# Patient Record
Sex: Male | Born: 1955 | Race: Black or African American | Hispanic: No | Smoking: Current some day smoker
Health system: Southern US, Community
[De-identification: ages and names within clinical notes are randomized; demographics above are authoritative.]

## PROBLEM LIST (undated history)

## (undated) DIAGNOSIS — E7800 Pure hypercholesterolemia, unspecified: Secondary | ICD-10-CM

## (undated) DIAGNOSIS — N200 Calculus of kidney: Secondary | ICD-10-CM

## (undated) DIAGNOSIS — M1990 Unspecified osteoarthritis, unspecified site: Secondary | ICD-10-CM

## (undated) DIAGNOSIS — E119 Type 2 diabetes mellitus without complications: Secondary | ICD-10-CM

## (undated) DIAGNOSIS — I10 Essential (primary) hypertension: Secondary | ICD-10-CM

## (undated) HISTORY — PX: HERNIA REPAIR: SHX51

## (undated) HISTORY — PX: TONSILLECTOMY: SUR1361

---

## 2004-07-21 ENCOUNTER — Emergency Department (HOSPITAL_COMMUNITY): Admission: EM | Admit: 2004-07-21 | Discharge: 2004-07-21 | Payer: Self-pay | Admitting: *Deleted

## 2006-09-29 ENCOUNTER — Emergency Department (HOSPITAL_COMMUNITY): Admission: EM | Admit: 2006-09-29 | Discharge: 2006-09-29 | Payer: Self-pay | Admitting: Emergency Medicine

## 2006-12-11 ENCOUNTER — Emergency Department (HOSPITAL_COMMUNITY): Admission: EM | Admit: 2006-12-11 | Discharge: 2006-12-11 | Payer: Self-pay | Admitting: Family Medicine

## 2007-07-30 ENCOUNTER — Emergency Department (HOSPITAL_COMMUNITY): Admission: EM | Admit: 2007-07-30 | Discharge: 2007-07-30 | Payer: Self-pay | Admitting: Emergency Medicine

## 2010-11-17 ENCOUNTER — Inpatient Hospital Stay (INDEPENDENT_AMBULATORY_CARE_PROVIDER_SITE_OTHER)
Admission: RE | Admit: 2010-11-17 | Discharge: 2010-11-17 | Disposition: A | Discharge status: Home / Self Care | Payer: Medicare Other | Source: Ambulatory Visit | Attending: Emergency Medicine | Admitting: Emergency Medicine

## 2010-11-17 ENCOUNTER — Ambulatory Visit (INDEPENDENT_AMBULATORY_CARE_PROVIDER_SITE_OTHER): Payer: Medicare Other

## 2010-11-17 DIAGNOSIS — J189 Pneumonia, unspecified organism: Secondary | ICD-10-CM

## 2010-11-26 ENCOUNTER — Ambulatory Visit (INDEPENDENT_AMBULATORY_CARE_PROVIDER_SITE_OTHER): Payer: Medicare Other

## 2010-11-26 ENCOUNTER — Inpatient Hospital Stay (INDEPENDENT_AMBULATORY_CARE_PROVIDER_SITE_OTHER)
Admission: RE | Admit: 2010-11-26 | Discharge: 2010-11-26 | Disposition: A | Discharge status: Home / Self Care | Payer: Medicare Other | Source: Ambulatory Visit | Attending: Family Medicine | Admitting: Family Medicine

## 2010-11-26 DIAGNOSIS — R05 Cough: Secondary | ICD-10-CM

## 2012-08-24 IMAGING — CR DG CHEST 2V
2 series · 2 of 2 positions shown · non-contrast
Comparison: 11/17/2010

CLINICAL DATA: Fever.  Cough.  Follow-up pneumonia.

CHEST - 2 VIEW

[view not recorded (1 of 2)]
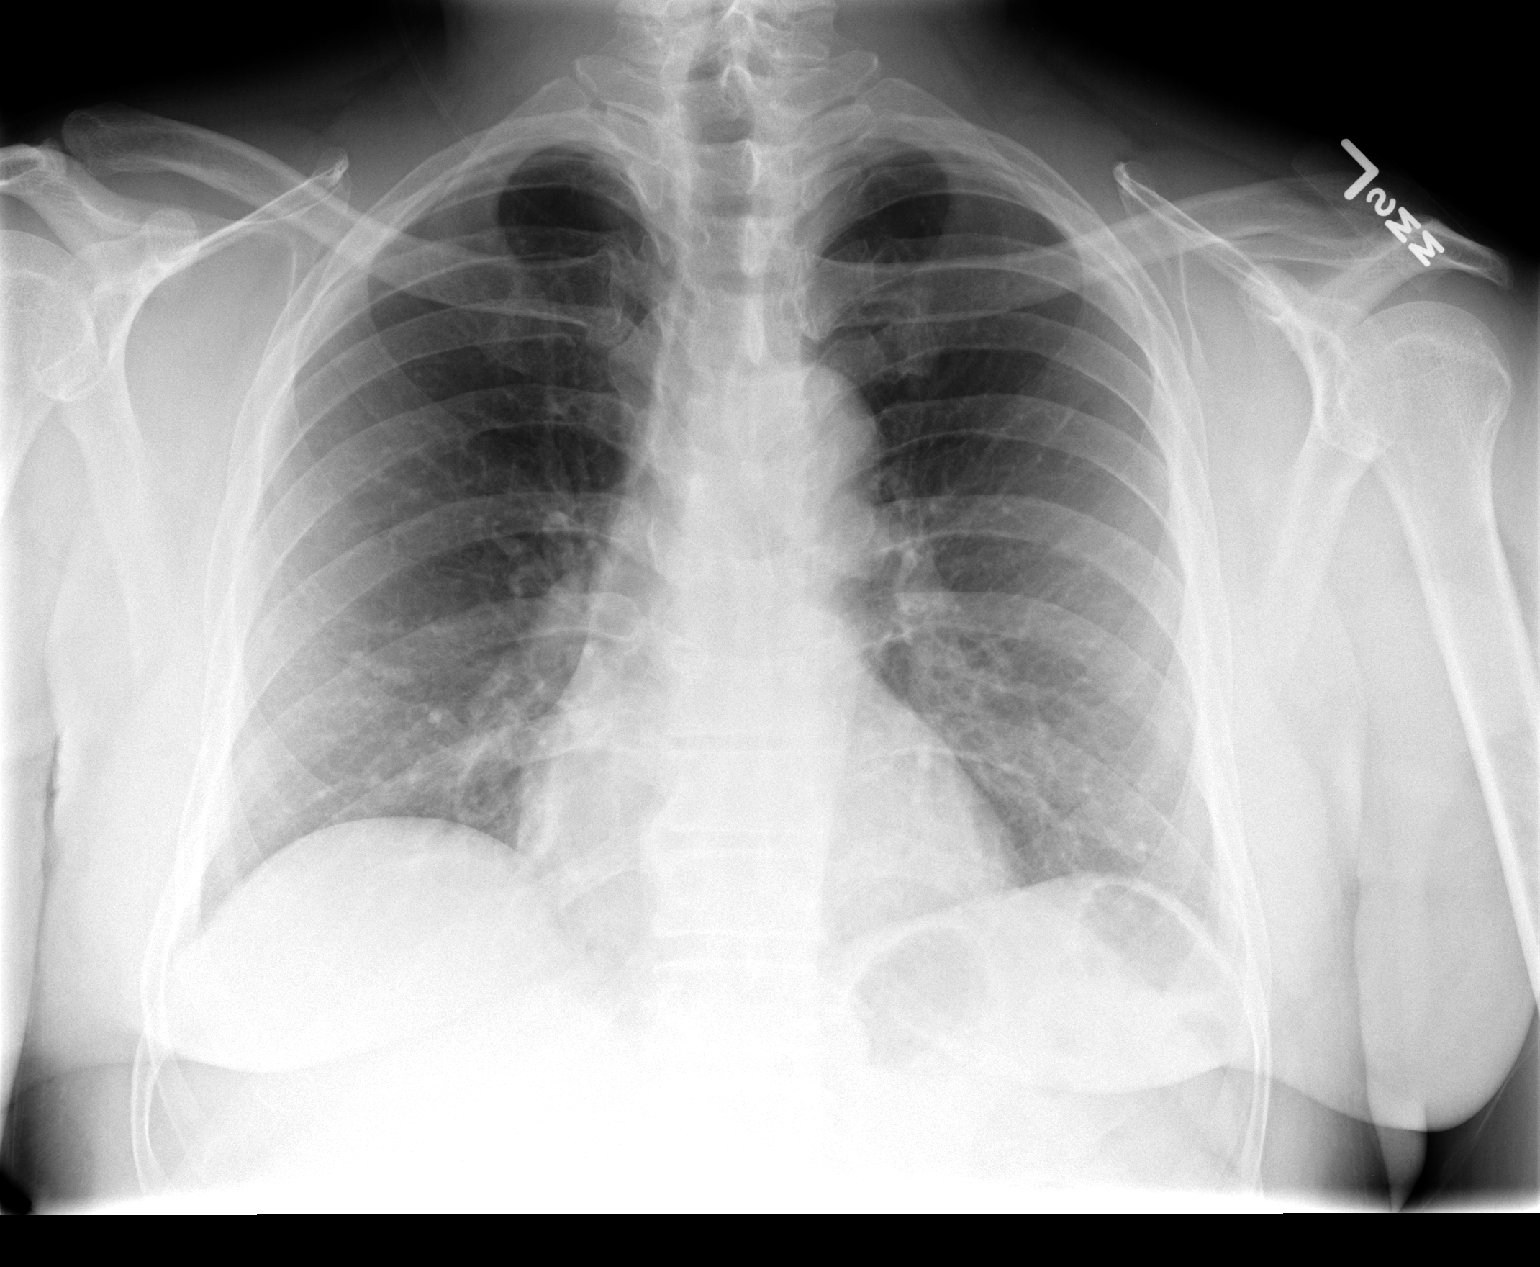

[view not recorded (2 of 2)]
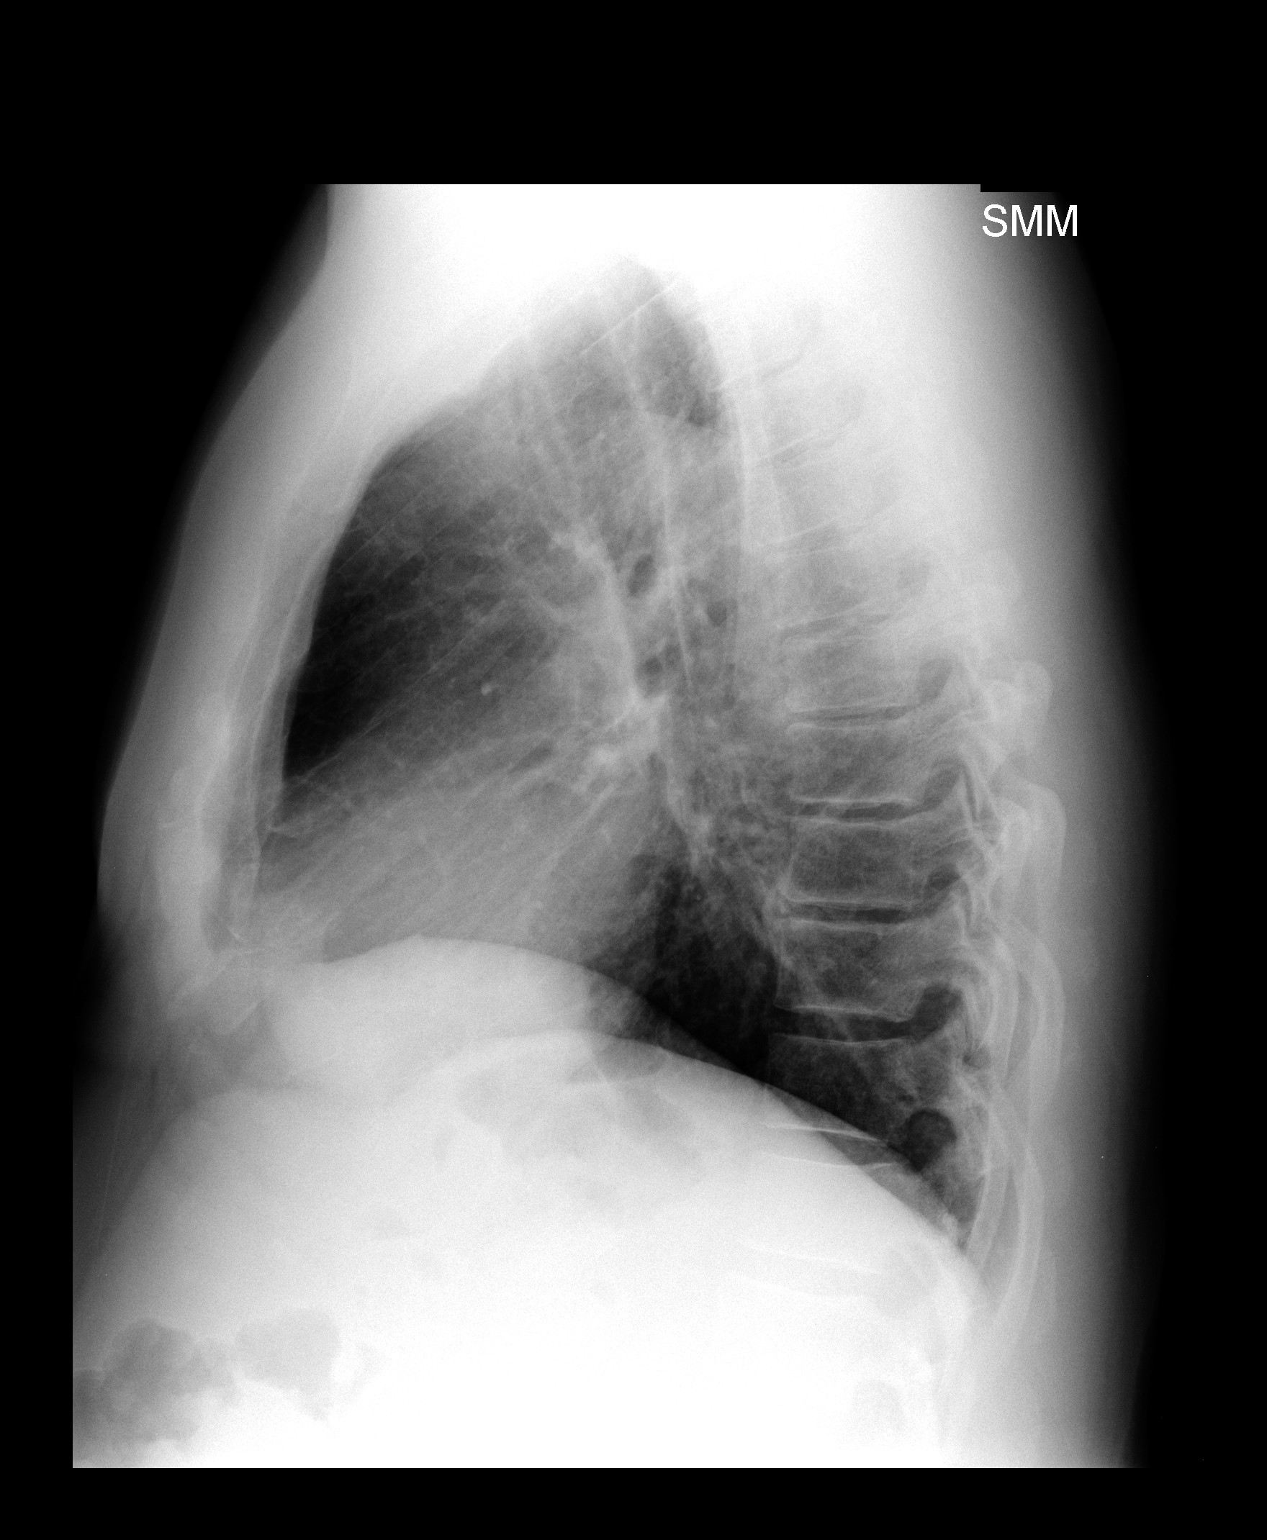

[2 of 2 positions shown; findings below may reference images not displayed]

FINDINGS: Previously seen focus of opacity in the right midlung is
no longer visualized on current exam.  Low lung volumes are noted,
however there is no evidence of pulmonary infiltrate or edema.  No
evidence of pleural effusion.  Heart size is normal.  No mass or
lymphadenopathy identified.
IMPRESSION: Resolution of right lung opacity since previous study.  No active
disease.

## 2015-06-28 ENCOUNTER — Encounter (HOSPITAL_COMMUNITY): Payer: Self-pay | Admitting: *Deleted

## 2015-06-28 DIAGNOSIS — I10 Essential (primary) hypertension: Secondary | ICD-10-CM | POA: Diagnosis not present

## 2015-06-28 DIAGNOSIS — R1032 Left lower quadrant pain: Secondary | ICD-10-CM | POA: Diagnosis present

## 2015-06-28 DIAGNOSIS — N200 Calculus of kidney: Secondary | ICD-10-CM | POA: Insufficient documentation

## 2015-06-28 DIAGNOSIS — R112 Nausea with vomiting, unspecified: Secondary | ICD-10-CM | POA: Diagnosis not present

## 2015-06-28 DIAGNOSIS — E119 Type 2 diabetes mellitus without complications: Secondary | ICD-10-CM | POA: Insufficient documentation

## 2015-06-28 LAB — COMPREHENSIVE METABOLIC PANEL
ALK PHOS: 48 U/L (ref 38–126)
ALT: 23 U/L (ref 17–63)
ANION GAP: 14 (ref 5–15)
AST: 22 U/L (ref 15–41)
Albumin: 4.3 g/dL (ref 3.5–5.0)
BILIRUBIN TOTAL: 0.5 mg/dL (ref 0.3–1.2)
BUN: 19 mg/dL (ref 6–20)
CALCIUM: 9.3 mg/dL (ref 8.9–10.3)
CO2: 22 mmol/L (ref 22–32)
Chloride: 102 mmol/L (ref 101–111)
Creatinine, Ser: 1.49 mg/dL — ABNORMAL HIGH (ref 0.61–1.24)
GFR calc non Af Amer: 49 mL/min — ABNORMAL LOW (ref >60)
GFR, EST AFRICAN AMERICAN: 57 mL/min — AB (ref >60)
Glucose, Bld: 218 mg/dL — ABNORMAL HIGH (ref 65–99)
Potassium: 4 mmol/L (ref 3.5–5.1)
SODIUM: 138 mmol/L (ref 135–145)
TOTAL PROTEIN: 7.3 g/dL (ref 6.5–8.1)

## 2015-06-28 LAB — CBC
HCT: 44.2 % (ref 39.0–52.0)
HEMOGLOBIN: 15 g/dL (ref 13.0–17.0)
MCH: 26.5 pg (ref 26.0–34.0)
MCHC: 33.9 g/dL (ref 30.0–36.0)
MCV: 78.1 fL (ref 78.0–100.0)
Platelets: 243 10*3/uL (ref 150–400)
RBC: 5.66 MIL/uL (ref 4.22–5.81)
RDW: 12.8 % (ref 11.5–15.5)
WBC: 7.5 10*3/uL (ref 4.0–10.5)

## 2015-06-28 LAB — URINALYSIS, ROUTINE W REFLEX MICROSCOPIC
Bilirubin Urine: NEGATIVE
Glucose, UA: NEGATIVE mg/dL
Ketones, ur: NEGATIVE mg/dL
NITRITE: NEGATIVE
Protein, ur: NEGATIVE mg/dL
SPECIFIC GRAVITY, URINE: 1.019 (ref 1.005–1.030)
pH: 7.5 (ref 5.0–8.0)

## 2015-06-28 LAB — URINE MICROSCOPIC-ADD ON

## 2015-06-28 LAB — LIPASE, BLOOD: Lipase: 24 U/L (ref 11–51)

## 2015-06-28 MED ORDER — ONDANSETRON 4 MG PO TBDP
4.0000 mg | ORAL_TABLET | Freq: Once | ORAL | Status: AC | PRN
  Administered 2015-06-28: 4 mg via ORAL

## 2015-06-28 MED ORDER — ONDANSETRON 4 MG PO TBDP
ORAL_TABLET | ORAL | Status: AC
  Filled 2015-06-28: qty 1

## 2015-06-28 NOTE — ED Notes (Signed)
Pt here for vomiting and left sided abdominal pain after eating chicken today.  Pt has vomited x8 times and is vomiting in triage.  No diarrhea or fever with this.

## 2015-06-29 ENCOUNTER — Emergency Department (HOSPITAL_COMMUNITY)
Admission: EM | Admit: 2015-06-29 | Discharge: 2015-06-29 | Disposition: A | Discharge status: Home / Self Care | Payer: Medicare Other | Attending: Emergency Medicine | Admitting: Emergency Medicine

## 2015-06-29 ENCOUNTER — Encounter (HOSPITAL_COMMUNITY): Payer: Self-pay | Admitting: Emergency Medicine

## 2015-06-29 ENCOUNTER — Emergency Department (HOSPITAL_COMMUNITY): Payer: Medicare Other

## 2015-06-29 ENCOUNTER — Other Ambulatory Visit: Payer: Self-pay | Admitting: Urology

## 2015-06-29 DIAGNOSIS — N200 Calculus of kidney: Secondary | ICD-10-CM

## 2015-06-29 DIAGNOSIS — R52 Pain, unspecified: Secondary | ICD-10-CM

## 2015-06-29 HISTORY — DX: Essential (primary) hypertension: I10

## 2015-06-29 HISTORY — DX: Pure hypercholesterolemia, unspecified: E78.00

## 2015-06-29 HISTORY — DX: Type 2 diabetes mellitus without complications: E11.9

## 2015-06-29 MED ORDER — ONDANSETRON 8 MG PO TBDP: ORAL_TABLET | ORAL | Status: AC

## 2015-06-29 MED ORDER — ONDANSETRON HCL 4 MG/2ML IJ SOLN
4.0000 mg | Freq: Once | INTRAMUSCULAR | Status: AC
  Administered 2015-06-29: 4 mg via INTRAVENOUS
  Filled 2015-06-29: qty 2

## 2015-06-29 MED ORDER — TAMSULOSIN HCL 0.4 MG PO CAPS
0.4000 mg | ORAL_CAPSULE | Freq: Every day | ORAL | Status: DC
  Administered 2015-06-29: 0.4 mg via ORAL
  Filled 2015-06-29: qty 1

## 2015-06-29 MED ORDER — FENTANYL CITRATE (PF) 100 MCG/2ML IJ SOLN
100.0000 ug | Freq: Once | INTRAMUSCULAR | Status: AC
  Administered 2015-06-29: 100 ug via INTRAVENOUS
  Filled 2015-06-29: qty 2

## 2015-06-29 MED ORDER — KETOROLAC TROMETHAMINE 30 MG/ML IJ SOLN
30.0000 mg | Freq: Once | INTRAMUSCULAR | Status: AC
  Administered 2015-06-29: 30 mg via INTRAVENOUS
  Filled 2015-06-29: qty 1

## 2015-06-29 MED ORDER — OXYCODONE-ACETAMINOPHEN 5-325 MG PO TABS: 1.0000 | ORAL_TABLET | Freq: Four times a day (QID) | ORAL | Status: AC | PRN

## 2015-06-29 MED ORDER — SODIUM CHLORIDE 0.9 % IV BOLUS (SEPSIS)
500.0000 mL | Freq: Once | INTRAVENOUS | Status: AC
  Administered 2015-06-29: 500 mL via INTRAVENOUS

## 2015-06-29 MED ORDER — FENTANYL CITRATE (PF) 100 MCG/2ML IJ SOLN
50.0000 ug | Freq: Once | INTRAMUSCULAR | Status: AC
  Administered 2015-06-29: 50 ug via INTRAVENOUS
  Filled 2015-06-29: qty 2

## 2015-06-29 MED ORDER — SODIUM CHLORIDE 0.9 % IV BOLUS (SEPSIS)
1000.0000 mL | Freq: Once | INTRAVENOUS | Status: AC
  Administered 2015-06-29: 1000 mL via INTRAVENOUS

## 2015-06-29 NOTE — ED Provider Notes (Signed)
CSN: [id number]     Arrival date & time 06/28/15  2103 History   By signing my name below, I, Arlan Organ, attest that this documentation has been prepared under the direction and in the presence of Ramil Edgington, MD.  Electronically Signed: Arlan Organ, ED Scribe. 06/29/2015. 12:55 AM.   Chief Complaint  Patient presents with  . Abdominal Pain   Patient is a 60 y.o. male presenting with abdominal pain. The history is provided by the patient. No language interpreter was used.  Abdominal Pain Pain location:  LLQ Pain quality: cramping   Pain radiates to:  Does not radiate Pain severity:  Moderate Timing:  Constant Progression:  Unchanged Context: eating   Relieved by:  None tried Worsened by:  Nothing tried Ineffective treatments:  None tried Associated symptoms: nausea and vomiting   Associated symptoms: no chest pain, no chills, no cough, no diarrhea, no fever and no shortness of breath   Risk factors: has not had multiple surgeries     HPI Comments: Stephen Espinoza is a 60 y.o. male with a PMHx of DM and HTN who presents to the Emergency Department complaining of constant, ongoing LLQ abdominal pain earlier this afternoon after eating chicken. Pain is described as cramping. He also reports ongoing nausea and vomiting. 8 episodes of vomiting reported since time of onset. No aggravating or alleviating factors at this time. No OTC medications or home remedies attempted prior to arrival. No recent fever, chills, chest pain, or shortness of breath. No prior history of same. No known allergies to medications.  PCP: No primary care provider on file.    Past Medical History  Diagnosis Date  . Diabetes mellitus without complication (HCC)   . Hypertension   . High blood cholesterol    History reviewed. No pertinent past surgical history. No family history on file. Social History  Substance Use Topics  . Smoking status: Never Smoker   . Smokeless tobacco: None  . Alcohol Use: No     Review of Systems  Constitutional: Negative for fever and chills.  Respiratory: Negative for cough and shortness of breath.   Cardiovascular: Negative for chest pain.  Gastrointestinal: Positive for nausea, vomiting and abdominal pain. Negative for diarrhea.  Neurological: Negative for headaches.  Psychiatric/Behavioral: Negative for confusion.  All other systems reviewed and are negative.     Allergies  Review of patient's allergies indicates no known allergies.  Home Medications   Prior to Admission medications   Not on File   Triage Vitals: BP 171/106 mmHg  Pulse 87  Temp(Src) 98.1 F (36.7 C) (Oral)  Resp 16  SpO2 100%   Physical Exam  Constitutional: He is oriented to person, place, and time. He appears well-developed and well-nourished. No distress.  HENT:  Head: Normocephalic and atraumatic.  Mouth/Throat: Oropharynx is clear and moist. No oropharyngeal exudate.  Eyes: EOM are normal. Pupils are equal, round, and reactive to light.  Neck: Normal range of motion. Neck supple.  Cardiovascular: Normal rate, regular rhythm, normal heart sounds and intact distal pulses.   Pulmonary/Chest: Effort normal and breath sounds normal. No respiratory distress. He has no wheezes. He has no rales.  Abdominal: Soft. Bowel sounds are normal. He exhibits no distension. There is no tenderness. There is no rebound and no guarding.  Musculoskeletal: Normal range of motion.  Neurological: He is alert and oriented to person, place, and time. He has normal reflexes.  Skin: Skin is warm and dry.  Psychiatric: He has a normal  mood and affect. Judgment normal.  Nursing note and vitals reviewed.   ED Course  Procedures (including critical care time)  DIAGNOSTIC STUDIES: Oxygen Saturation is 100% on RA, Normal by my interpretation.    COORDINATION OF CARE: 12:48 AM- Will order blood work, urinalysis, and imaging. Discussed treatment plan with pt at bedside and pt agreed to plan.      3:18 AM- on reevaluation, pts pain is down about 60%. Updated family on results and consulted with Urology.  3:49 AM- Spoke with Urology. If patient can PO challenge he can go. If he can not call Urology resident.  Labs Review Labs Reviewed  COMPREHENSIVE METABOLIC PANEL - Abnormal; Notable for the following:    Glucose, Bld 218 (*)    Creatinine, Ser 1.49 (*)    GFR calc non Af Amer 49 (*)    GFR calc Af Amer 57 (*)    All other components within normal limits  URINALYSIS, ROUTINE W REFLEX MICROSCOPIC (NOT AT [facility]) - Abnormal; Notable for the following:    APPearance CLOUDY (*)    Hgb urine dipstick LARGE (*)    Leukocytes, UA SMALL (*)    All other components within normal limits  URINE MICROSCOPIC-ADD ON - Abnormal; Notable for the following:    Squamous Epithelial / LPF 0-5 (*)    Bacteria, UA RARE (*)    All other components within normal limits  LIPASE, BLOOD  CBC    Imaging Review No results found. I have personally reviewed and evaluated these images and lab results as part of my medical decision-making.   EKG Interpretation None      MDM   Final diagnoses:  Pain      Filed Vitals:   06/28/15 2111 06/28/15 2113  BP: 171/106   Pulse:  87  Temp:  98.1 F (36.7 C)  Resp:  16     Results for orders placed or performed during the hospital encounter of 06/29/15  Lipase, blood  Result Value Ref Range   Lipase 24 11 - 51 U/L  Comprehensive metabolic panel  Result Value Ref Range   Sodium 138 135 - 145 mmol/L   Potassium 4.0 3.5 - 5.1 mmol/L   Chloride 102 101 - 111 mmol/L   CO2 22 22 - 32 mmol/L   Glucose, Bld 218 (H) 65 - 99 mg/dL   BUN 19 6 - 20 mg/dL   Creatinine, Ser 1.61 (H) 0.61 - 1.24 mg/dL   Calcium 9.3 8.9 - 09.6 mg/dL   Total Protein 7.3 6.5 - 8.1 g/dL   Albumin 4.3 3.5 - 5.0 g/dL   AST 22 15 - 41 U/L   ALT 23 17 - 63 U/L   Alkaline Phosphatase 48 38 - 126 U/L   Total Bilirubin 0.5 0.3 - 1.2 mg/dL   GFR calc non Af Amer 49 (L) >60  mL/min   GFR calc Af Amer 57 (L) >60 mL/min   Anion gap 14 5 - 15  CBC  Result Value Ref Range   WBC 7.5 4.0 - 10.5 K/uL   RBC 5.66 4.22 - 5.81 MIL/uL   Hemoglobin 15.0 13.0 - 17.0 g/dL   HCT 04.5 40.9 - 81.1 %   MCV 78.1 78.0 - 100.0 fL   MCH 26.5 26.0 - 34.0 pg   MCHC 33.9 30.0 - 36.0 g/dL   RDW 91.4 78.2 - 95.6 %   Platelets 243 150 - 400 K/uL  Urinalysis, Routine w reflex microscopic (not at [facility])  Result Value Ref Range   Color, Urine YELLOW YELLOW   APPearance CLOUDY (A) CLEAR   Specific Gravity, Urine 1.019 1.005 - 1.030   pH 7.5 5.0 - 8.0   Glucose, UA NEGATIVE NEGATIVE mg/dL   Hgb urine dipstick LARGE (A) NEGATIVE   Bilirubin Urine NEGATIVE NEGATIVE   Ketones, ur NEGATIVE NEGATIVE mg/dL   Protein, ur NEGATIVE NEGATIVE mg/dL   Nitrite NEGATIVE NEGATIVE   Leukocytes, UA SMALL (A) NEGATIVE  Urine microscopic-add on  Result Value Ref Range   Squamous Epithelial / LPF 0-5 (A) NONE SEEN   WBC, UA 0-5 0 - 5 WBC/hpf   RBC / HPF TOO NUMEROUS TO COUNT 0 - 5 RBC/hpf   Bacteria, UA RARE (A) NONE SEEN    Medications  ondansetron (ZOFRAN-ODT) 4 MG disintegrating tablet (not administered)  tamsulosin (FLOMAX) capsule 0.4 mg (0.4 mg Oral Given 06/29/15 0404)  ondansetron (ZOFRAN-ODT) disintegrating tablet 4 mg (4 mg Oral Given 06/28/15 2116)  ketorolac (TORADOL) 30 MG/ML injection 30 mg (30 mg Intravenous Given 06/29/15 0121)  sodium chloride 0.9 % bolus 1,000 mL (0 mLs Intravenous Stopped 06/29/15 0215)  ondansetron (ZOFRAN) injection 4 mg (4 mg Intravenous Given 06/29/15 0121)  fentaNYL (SUBLIMAZE) injection 50 mcg (50 mcg Intravenous Given 06/29/15 0121)  fentaNYL (SUBLIMAZE) injection 100 mcg (100 mcg Intravenous Given 06/29/15 0404)  sodium chloride 0.9 % bolus 500 mL (0 mLs Intravenous Stopped 06/29/15 0545)   Filed Vitals:   06/29/15 0515 06/29/15 0530  BP: 130/81 143/81  Pulse: 71 81  Temp:    Resp: 14 15    Case d/w Dr. Mena GoesEskridge if pain is controlled anf patient is able  to aPO challenge.  D/c home and have call office for appointment for shock wave.  Pain free, PO challenged successfully.  Stable for d/c Call Dr. Mena GoesEskridge today will d/c with percocet and zofran.  Strict return precautions given   I personally performed the services described in this documentation, which was scribed in my presence. The recorded information has been reviewed and is accurate.     Cy BlamerApril Reyaansh Merlo, MD 06/29/15 [phone]

## 2015-06-29 NOTE — ED Notes (Signed)
Patient transported to CT 

## 2015-06-29 NOTE — ED Notes (Signed)
Pt and caregiver verbalized understanding of discharge instructions and follow-up care.

## 2015-07-02 ENCOUNTER — Ambulatory Visit (HOSPITAL_COMMUNITY): Payer: Medicare Other

## 2015-07-02 ENCOUNTER — Ambulatory Visit (HOSPITAL_COMMUNITY)
Admission: RE | Admit: 2015-07-02 | Discharge: 2015-07-02 | Disposition: A | Discharge status: Home / Self Care | Payer: Medicare Other | Source: Ambulatory Visit | Attending: Urology | Admitting: Urology

## 2015-07-02 ENCOUNTER — Encounter (HOSPITAL_COMMUNITY): Payer: Self-pay | Admitting: General Practice

## 2015-07-02 ENCOUNTER — Encounter (HOSPITAL_COMMUNITY)
Admission: RE | Disposition: A | Discharge status: Home / Self Care | Payer: Self-pay | Source: Ambulatory Visit | Attending: Urology

## 2015-07-02 DIAGNOSIS — N200 Calculus of kidney: Secondary | ICD-10-CM | POA: Diagnosis not present

## 2015-07-02 DIAGNOSIS — E119 Type 2 diabetes mellitus without complications: Secondary | ICD-10-CM | POA: Insufficient documentation

## 2015-07-02 DIAGNOSIS — N201 Calculus of ureter: Secondary | ICD-10-CM

## 2015-07-02 DIAGNOSIS — I10 Essential (primary) hypertension: Secondary | ICD-10-CM | POA: Insufficient documentation

## 2015-07-02 HISTORY — DX: Unspecified osteoarthritis, unspecified site: M19.90

## 2015-07-02 HISTORY — DX: Calculus of kidney: N20.0

## 2015-07-02 LAB — GLUCOSE, CAPILLARY: Glucose-Capillary: 130 mg/dL — ABNORMAL HIGH (ref 65–99)

## 2015-07-02 SURGERY — LITHOTRIPSY, ESWL
Anesthesia: LOCAL | Laterality: Left

## 2015-07-02 MED ORDER — KETOROLAC TROMETHAMINE 30 MG/ML IJ SOLN: 30.0000 mg | Freq: Four times a day (QID) | INTRAMUSCULAR | Status: DC

## 2015-07-02 MED ORDER — DIPHENHYDRAMINE HCL 25 MG PO CAPS
25.0000 mg | ORAL_CAPSULE | ORAL | Status: AC
  Administered 2015-07-02: 25 mg via ORAL
  Filled 2015-07-02: qty 1

## 2015-07-02 MED ORDER — ACETAMINOPHEN 650 MG RE SUPP
650.0000 mg | RECTAL | Status: DC | PRN
  Filled 2015-07-02: qty 1

## 2015-07-02 MED ORDER — ACETAMINOPHEN 325 MG PO TABS: 650.0000 mg | ORAL_TABLET | ORAL | Status: DC | PRN

## 2015-07-02 MED ORDER — OXYCODONE HCL 5 MG PO TABS: 5.0000 mg | ORAL_TABLET | ORAL | Status: DC | PRN

## 2015-07-02 MED ORDER — SODIUM CHLORIDE 0.9% FLUSH: 3.0000 mL | Freq: Two times a day (BID) | INTRAVENOUS | Status: DC

## 2015-07-02 MED ORDER — SODIUM CHLORIDE 0.9% FLUSH: 3.0000 mL | INTRAVENOUS | Status: DC | PRN

## 2015-07-02 MED ORDER — DIAZEPAM 5 MG PO TABS
10.0000 mg | ORAL_TABLET | ORAL | Status: AC
  Administered 2015-07-02: 10 mg via ORAL
  Filled 2015-07-02: qty 2

## 2015-07-02 MED ORDER — SODIUM CHLORIDE 0.9 % IV SOLN
INTRAVENOUS | Status: DC
  Administered 2015-07-02: 07:00:00 via INTRAVENOUS

## 2015-07-02 MED ORDER — CIPROFLOXACIN HCL 500 MG PO TABS
500.0000 mg | ORAL_TABLET | ORAL | Status: AC
  Administered 2015-07-02: 500 mg via ORAL
  Filled 2015-07-02: qty 1

## 2015-07-02 MED ORDER — SODIUM CHLORIDE 0.9 % IV SOLN: 250.0000 mL | INTRAVENOUS | Status: DC | PRN

## 2015-07-02 NOTE — H&P (Signed)
  Urology History and Physical Exam  CC: Left sided kidney stone  HPI: 60 year old male who presented to our office on 4/28 with a 10 mm left UPJ stone, 10 mm in size. Dr Sherron MondayMacDiarmid discussed therapy and he and the pt decided on ESWL. Risks/complications as well as other therepeutic alternatives were also discussed. The pt understands these and now presents for ESWL.  PMH: Past Medical History  Diagnosis Date  . Diabetes mellitus without complication (HCC)   . Hypertension   . High blood cholesterol     PSH: No past surgical history on file.  Allergies: Allergies  Allergen Reactions  . Motrin [Ibuprofen] Other (See Comments)    Made me sick    Medications: Prescriptions prior to admission  Medication Sig Dispense Refill Last Dose  . ondansetron (ZOFRAN ODT) 8 MG disintegrating tablet 8mg  ODT q8 hours prn nausea 12 tablet 0   . oxyCODONE (OXY IR/ROXICODONE) 5 MG immediate release tablet Take 15 mg by mouth every 4 (four) hours as needed for severe pain.   06/28/2015 at Unknown time  . oxyCODONE-acetaminophen (PERCOCET) 5-325 MG tablet Take 1 tablet by mouth every 6 (six) hours as needed. 17 tablet 0      Social History: Social History   Social History  . Marital Status: Single    Spouse Name: N/A  . Number of Children: N/A  . Years of Education: N/A   Occupational History  . Not on file.   Social History Main Topics  . Smoking status: Never Smoker   . Smokeless tobacco: Not on file  . Alcohol Use: No  . Drug Use: Not on file  . Sexual Activity: Not on file   Other Topics Concern  . Not on file   Social History Narrative    Family History: No family history on file.  Review of Systems: Positive: Left flank pain Constitutional, skin, eye, otolaryngeal, hematologic/lymphatic, cardiovascular, pulmonary, endocrine, musculoskeletal, gastrointestinal, neurological and psychiatric system(s) were reviewed and pertinent findings if present are noted and are  otherwise negative.  Genitourinary: urinary frequency.                  Physical Exam: @VITALS2 @ General: No acute distress.  Awake. Head:  Normocephalic.  Atraumatic. ENT:  EOMI.  Mucous membranes moist Neck:  Supple.  No lymphadenopathy. CV:  S1 present. S2 present. Regular rate. Pulmonary: Equal effort bilaterally.  Clear to auscultation bilaterally. Abdomen: Soft.  Non tender to palpation. Skin:  Normal turgor.  No visible rash. Extremity: No gross deformity of bilateral upper extremities.  No gross deformity of                             lower extremities. Neurologic: Alert. Appropriate mood.  Studies:  No results for input(s): HGB, WBC, PLT in the last 72 hours.  No results for input(s): NA, K, CL, CO2, BUN, CREATININE, CALCIUM, GFRNONAA, GFRAA in the last 72 hours.  Invalid input(s): MAGNESIUM   No results for input(s): INR, APTT in the last 72 hours.  Invalid input(s): PT   Invalid input(s): ABG   SSD 14 cm HU 1200   Assessment:  10 mm left UPJ stone  Plan: Left ESWL

## 2015-07-02 NOTE — Discharge Instructions (Signed)
See [facility] discharge instructions in chart.  

## 2016-02-19 ENCOUNTER — Emergency Department (HOSPITAL_COMMUNITY)
Admission: EM | Admit: 2016-02-19 | Discharge: 2016-02-19 | Disposition: A | Discharge status: Home / Self Care | Payer: Medicare Other | Attending: Dermatology | Admitting: Dermatology

## 2016-02-19 ENCOUNTER — Encounter (HOSPITAL_COMMUNITY): Payer: Self-pay | Admitting: Emergency Medicine

## 2016-02-19 DIAGNOSIS — E119 Type 2 diabetes mellitus without complications: Secondary | ICD-10-CM | POA: Insufficient documentation

## 2016-02-19 DIAGNOSIS — Z5321 Procedure and treatment not carried out due to patient leaving prior to being seen by health care provider: Secondary | ICD-10-CM | POA: Insufficient documentation

## 2016-02-19 DIAGNOSIS — S61210A Laceration without foreign body of right index finger without damage to nail, initial encounter: Secondary | ICD-10-CM | POA: Insufficient documentation

## 2016-02-19 DIAGNOSIS — Z7982 Long term (current) use of aspirin: Secondary | ICD-10-CM | POA: Insufficient documentation

## 2016-02-19 DIAGNOSIS — Z7984 Long term (current) use of oral hypoglycemic drugs: Secondary | ICD-10-CM | POA: Insufficient documentation

## 2016-02-19 DIAGNOSIS — W268XXA Contact with other sharp object(s), not elsewhere classified, initial encounter: Secondary | ICD-10-CM | POA: Insufficient documentation

## 2016-02-19 DIAGNOSIS — Y929 Unspecified place or not applicable: Secondary | ICD-10-CM | POA: Insufficient documentation

## 2016-02-19 DIAGNOSIS — Y9389 Activity, other specified: Secondary | ICD-10-CM | POA: Diagnosis not present

## 2016-02-19 DIAGNOSIS — I10 Essential (primary) hypertension: Secondary | ICD-10-CM | POA: Insufficient documentation

## 2016-02-19 DIAGNOSIS — Y999 Unspecified external cause status: Secondary | ICD-10-CM | POA: Insufficient documentation

## 2016-02-19 NOTE — ED Triage Notes (Signed)
Patient states that he was opening a can and cut her right pointer finger.

## 2016-02-19 NOTE — ED Notes (Signed)
Called Pt 3X. Instructed Pt is in subwaiting. Pt not in subwaiting.

## 2016-02-19 NOTE — ED Notes (Signed)
Pt called for in waiting area x3 by this EMT after finding pt not to be in sub waiting.

## 2021-01-23 ENCOUNTER — Ambulatory Visit: Payer: Medicare Other | Admitting: Podiatry

## 2021-01-30 ENCOUNTER — Ambulatory Visit (INDEPENDENT_AMBULATORY_CARE_PROVIDER_SITE_OTHER): Payer: Medicare Other | Admitting: Podiatry

## 2021-01-30 DIAGNOSIS — Z91199 Patient's noncompliance with other medical treatment and regimen due to unspecified reason: Secondary | ICD-10-CM

## 2021-01-30 NOTE — Progress Notes (Signed)
No show

## 2021-02-06 ENCOUNTER — Other Ambulatory Visit: Payer: Self-pay

## 2021-02-06 ENCOUNTER — Encounter: Payer: Self-pay | Admitting: Podiatry

## 2021-02-06 ENCOUNTER — Ambulatory Visit (INDEPENDENT_AMBULATORY_CARE_PROVIDER_SITE_OTHER): Payer: Medicare Other | Admitting: Podiatry

## 2021-02-06 DIAGNOSIS — M25371 Other instability, right ankle: Secondary | ICD-10-CM

## 2021-02-06 DIAGNOSIS — E1142 Type 2 diabetes mellitus with diabetic polyneuropathy: Secondary | ICD-10-CM

## 2021-02-06 NOTE — Progress Notes (Signed)
  Subjective:  Patient ID: Stephen Espinoza, male    DOB: 12/29/1955,   MRN: [id number]  Chief Complaint  Patient presents with   Nail Problem    Diabetic foot exam    65 y.o. male presents for diabetic foot exam. Patient relates his primary care sent him over to check his feet. Also has some concerns for balance and instability in his right ankle. Relates that he feels like his right foot will give out on him. Has not tried any treatments . Denies any other pedal complaints. Last A1c was Denies n/v/f/c.   PCP Lerry Liner MD   Past Medical History:  Diagnosis Date   Arthritis    Diabetes mellitus without complication (HCC)    High blood cholesterol    Hypertension    Kidney stones     Objective:  Physical Exam: Vascular: DP/PT pulses 2/4 bilateral. CFT <3 seconds. Normal hair growth on digits. No edema.  Skin. No lacerations or abrasions bilateral feet.  Musculoskeletal: MMT 5/5 bilateral lower extremities in DF, PF, Inversion and 4/5 in eversion.  Deceased ROM in DF of ankle joint.  No pain to palpation . Achilles tendon intact.  Neurological: Sensation intact to light touch. Protective sensation diminished.   Assessment:   1. Type 2 diabetes mellitus with diabetic polyneuropathy, unspecified whether long term insulin use (HCC)   2. Right ankle instability      Plan:  Patient was evaluated and treated and all questions answered. -Discussed and educated patient on diabetic foot care, especially with regards to the vascular, neurological and musculoskeletal systems.  -Stressed the importance of good glycemic control and the detriment of not  controlling glucose levels in relation to the foot. -Discussed supportive shoes at all times and checking feet regularly.  -Recommend PT to work on strength and stability of the right foot. Patient would like to follow-up with PCP to make sure he can get [facility].  -Answered all patient questions -Patient to return  in 1  year for diabetic foot check.  -Patient advised to call the office if any problems or questions arise in the meantime.   Louann Sjogren, DPM

## 2022-08-27 ENCOUNTER — Other Ambulatory Visit: Payer: Self-pay

## 2022-08-27 ENCOUNTER — Emergency Department (HOSPITAL_COMMUNITY)
Admission: EM | Admit: 2022-08-27 | Discharge: 2022-08-27 | Disposition: A | Discharge status: Home / Self Care | Payer: No Typology Code available for payment source | Attending: Emergency Medicine | Admitting: Emergency Medicine

## 2022-08-27 ENCOUNTER — Emergency Department (HOSPITAL_BASED_OUTPATIENT_CLINIC_OR_DEPARTMENT_OTHER): Payer: No Typology Code available for payment source

## 2022-08-27 ENCOUNTER — Encounter (HOSPITAL_COMMUNITY): Payer: Self-pay | Admitting: Emergency Medicine

## 2022-08-27 DIAGNOSIS — M79604 Pain in right leg: Secondary | ICD-10-CM | POA: Insufficient documentation

## 2022-08-27 DIAGNOSIS — Z7982 Long term (current) use of aspirin: Secondary | ICD-10-CM | POA: Insufficient documentation

## 2022-08-27 DIAGNOSIS — M25551 Pain in right hip: Secondary | ICD-10-CM | POA: Diagnosis not present

## 2022-08-27 DIAGNOSIS — M7989 Other specified soft tissue disorders: Secondary | ICD-10-CM | POA: Diagnosis not present

## 2022-08-27 LAB — CBC
HCT: 53.2 % — ABNORMAL HIGH (ref 39.0–52.0)
Hemoglobin: 17.4 g/dL — ABNORMAL HIGH (ref 13.0–17.0)
MCH: 26.3 pg (ref 26.0–34.0)
MCHC: 32.7 g/dL (ref 30.0–36.0)
MCV: 80.4 fL (ref 80.0–100.0)
Platelets: 255 10*3/uL (ref 150–400)
RBC: 6.62 MIL/uL — ABNORMAL HIGH (ref 4.22–5.81)
RDW: 13.4 % (ref 11.5–15.5)
WBC: 6.2 10*3/uL (ref 4.0–10.5)
nRBC: 0 % (ref 0.0–0.2)

## 2022-08-27 LAB — BASIC METABOLIC PANEL
Anion gap: 12 (ref 5–15)
BUN: 13 mg/dL (ref 8–23)
CO2: 23 mmol/L (ref 22–32)
Calcium: 9.6 mg/dL (ref 8.9–10.3)
Chloride: 102 mmol/L (ref 98–111)
Creatinine, Ser: 1.09 mg/dL (ref 0.61–1.24)
GFR, Estimated: >60 mL/min (ref >60)
Glucose, Bld: 211 mg/dL — ABNORMAL HIGH (ref 70–99)
Potassium: 4 mmol/L (ref 3.5–5.1)
Sodium: 137 mmol/L (ref 135–145)

## 2022-08-27 MED ORDER — KETOROLAC TROMETHAMINE 60 MG/2ML IM SOLN
30.0000 mg | Freq: Once | INTRAMUSCULAR | Status: AC
  Administered 2022-08-27: 30 mg via INTRAMUSCULAR
  Filled 2022-08-27: qty 2

## 2022-08-27 NOTE — ED Notes (Signed)
PT c/o pain from right hip to mid calf x 2 weeks. Denies trauma.

## 2022-08-27 NOTE — ED Triage Notes (Signed)
Pt endorses intermittent right leg pain for 2 weeks. Seen at [facility] 2 days ago and had negative xrays. Denies any trauma to leg. No swelling noticed but pt states it was swollen at one point.

## 2022-08-27 NOTE — Discharge Instructions (Addendum)
Your ultrasound was negative for blood clot in your legs and had intact pulses in your legs.  Your lab evaluation was reassuring.  As you previously had x-ray imaging of the hip at the [place], I do not think further imaging evaluation is indicated at this time.  Recommend you follow-up with your primary care provider

## 2022-08-27 NOTE — ED Provider Notes (Signed)
Standard EMERGENCY DEPARTMENT AT [facility] Provider Note   CSN: [id number] Arrival date & time: 08/27/22  1435     History {Add pertinent medical, surgical, social history, OB history to HPI:1} Chief Complaint  Patient presents with   Leg Pain    Stephen Espinoza is a 67 y.o. male.   Leg Pain      Home Medications Prior to Admission medications   Medication Sig Start Date End Date Taking? Authorizing Provider  aspirin 81 MG tablet Take 81 mg by mouth daily.    [provider]  metFORMIN (GLUCOPHAGE) 1000 MG tablet Take 1,000 mg by mouth daily.    [provider]  ondansetron (ZOFRAN ODT) 8 MG disintegrating tablet 8mg  ODT q8 hours prn nausea 06/29/15   Palumbo, April, MD  oxyCODONE (OXY IR/ROXICODONE) 5 MG immediate release tablet Take 15 mg by mouth every 4 (four) hours as needed for severe pain.    [provider]  oxyCODONE-acetaminophen (PERCOCET) 5-325 MG tablet Take 1 tablet by mouth every 6 (six) hours as needed. 06/29/15   Palumbo, April, MD      Allergies    Motrin [ibuprofen]    Review of Systems   Review of Systems  Physical Exam Updated Vital Signs BP 138/83 (BP Location: Right Arm)   Pulse 77   Temp 98 F (36.7 C) (Oral)   Resp 15   Ht 5\' 10"  (1.778 m)   Wt 89.8 kg   SpO2 99%   BMI 28.41 kg/m  Physical Exam  ED Results / Procedures / Treatments   Labs (all labs ordered are listed, but only abnormal results are displayed) Labs Reviewed  CBC - Abnormal; Notable for the following components:      Result Value   RBC 6.62 (*)    Hemoglobin 17.4 (*)    HCT 53.2 (*)    All other components within normal limits  BASIC METABOLIC PANEL - Abnormal; Notable for the following components:   Glucose, Bld 211 (*)    All other components within normal limits    EKG None  Radiology VAS [place] LOWER EXTREMITY VENOUS (DVT) (ONLY [facility] & [facility])  Result Date: 08/27/2022  Lower Venous DVT Study Patient Name:  Stephen Espinoza  Date of Exam:   08/27/2022 Medical Rec #: [id number]         Accession #:    [id number] Date of Birth: [birth year]         Patient Gender: M Patient Age:   49 years Exam Location:  [facility] Procedure:      VAS [place] LOWER EXTREMITY VENOUS (DVT) Referring Phys: Benjiman Core --------------------------------------------------------------------------------  Indications: Swelling.  Risk Factors: None identified. Comparison Study: No prior studies. Performing Technologist: Chanda Busing RVT  Examination Guidelines: A complete evaluation includes B-mode imaging, spectral Doppler, color Doppler, and power Doppler as needed of all accessible portions of each vessel. Bilateral testing is considered an integral part of a complete examination. Limited examinations for reoccurring indications may be performed as noted. The reflux portion of the exam is performed with the patient in reverse Trendelenburg.  +---------+---------------+---------+-----------+----------+--------------+ RIGHT    CompressibilityPhasicitySpontaneityPropertiesThrombus Aging +---------+---------------+---------+-----------+----------+--------------+ CFV      Full           Yes      Yes                                 +---------+---------------+---------+-----------+----------+--------------+  SFJ      Full                                                        +---------+---------------+---------+-----------+----------+--------------+ FV Prox  Full                                                        +---------+---------------+---------+-----------+----------+--------------+ FV Mid   Full                                                        +---------+---------------+---------+-----------+----------+--------------+ FV DistalFull                                                        +---------+---------------+---------+-----------+----------+--------------+ PFV      Full                                                         +---------+---------------+---------+-----------+----------+--------------+ POP      Full           Yes      Yes                                 +---------+---------------+---------+-----------+----------+--------------+ PTV      Full                                                        +---------+---------------+---------+-----------+----------+--------------+ PERO     Full                                                        +---------+---------------+---------+-----------+----------+--------------+   +----+---------------+---------+-----------+----------+--------------+ LEFTCompressibilityPhasicitySpontaneityPropertiesThrombus Aging +----+---------------+---------+-----------+----------+--------------+ CFV Full           Yes      Yes                                 +----+---------------+---------+-----------+----------+--------------+     Summary: RIGHT: - There is no evidence of deep vein thrombosis in the lower extremity.  - No cystic structure found in the popliteal fossa.  LEFT: - No evidence of common femoral vein obstruction.  *See table(s) above for measurements and observations.    Preliminary  Procedures Procedures  {Document cardiac monitor, telemetry assessment procedure when appropriate:1}  Medications Ordered in ED Medications - No data to display  ED Course/ Medical Decision Making/ A&P   {   Click here for ABCD2, HEART and other calculatorsREFRESH Note before signing :1}                          Medical Decision Making Amount and/or Complexity of Data Reviewed Labs: ordered.  Risk Prescription drug management.   ***  {Document critical care time when appropriate:1} {Document review of labs and clinical decision tools ie heart score, Chads2Vasc2 etc:1}  {Document your independent review of radiology images, and any outside records:1} {Document your discussion with family members, caretakers,  and with consultants:1} {Document social determinants of health affecting pt's care:1} {Document your decision making why or why not admission, treatments were needed:1} Final Clinical Impression(s) / ED Diagnoses Final diagnoses:  None    Rx / DC Orders ED Discharge Orders     None

## 2022-08-27 NOTE — Progress Notes (Signed)
Right lower extremity venous duplex has been completed. Preliminary results can be found in CV Proc through chart review.  Results were given to Dr. Rubin Payor.   08/27/22 3:37 PM Olen Cordial RVT
